# Patient Record
Sex: Female | Born: 1935 | Race: White | Hispanic: No | Marital: Single
Health system: Southern US, Community
[De-identification: ages and names within clinical notes are randomized; demographics above are authoritative.]

---

## 2003-07-30 ENCOUNTER — Encounter: Admission: RE | Admit: 2003-07-30 | Discharge: 2003-07-30 | Payer: Self-pay | Admitting: Neurosurgery

## 2003-08-26 ENCOUNTER — Encounter: Admission: RE | Admit: 2003-08-26 | Discharge: 2003-08-26 | Payer: Self-pay | Admitting: Neurosurgery

## 2003-09-11 ENCOUNTER — Encounter: Admission: RE | Admit: 2003-09-11 | Discharge: 2003-09-11 | Payer: Self-pay | Admitting: Neurosurgery

## 2004-03-10 ENCOUNTER — Ambulatory Visit: Payer: Self-pay

## 2004-12-06 ENCOUNTER — Inpatient Hospital Stay: Payer: Self-pay

## 2005-10-27 ENCOUNTER — Ambulatory Visit: Payer: Self-pay | Admitting: Family Medicine

## 2005-11-28 ENCOUNTER — Ambulatory Visit: Payer: Self-pay | Admitting: Family Medicine

## 2005-11-28 ENCOUNTER — Ambulatory Visit: Payer: Self-pay | Admitting: Gastroenterology

## 2005-12-16 ENCOUNTER — Ambulatory Visit: Payer: Self-pay | Admitting: Family Medicine

## 2005-12-29 ENCOUNTER — Ambulatory Visit: Payer: Self-pay | Admitting: Gastroenterology

## 2006-07-02 ENCOUNTER — Emergency Department: Payer: Self-pay | Admitting: Emergency Medicine

## 2006-07-05 ENCOUNTER — Ambulatory Visit: Payer: Self-pay | Admitting: Gastroenterology

## 2007-03-13 ENCOUNTER — Ambulatory Visit: Payer: Self-pay | Admitting: Family Medicine

## 2007-07-16 ENCOUNTER — Ambulatory Visit: Payer: Self-pay | Admitting: Ophthalmology

## 2008-04-23 ENCOUNTER — Ambulatory Visit: Payer: Self-pay | Admitting: Family Medicine

## 2008-12-01 ENCOUNTER — Ambulatory Visit: Payer: Self-pay | Admitting: Neurology

## 2009-06-03 ENCOUNTER — Ambulatory Visit: Payer: Self-pay | Admitting: Family Medicine

## 2009-12-01 ENCOUNTER — Emergency Department: Payer: Self-pay | Admitting: Emergency Medicine

## 2010-05-30 ENCOUNTER — Encounter: Payer: Self-pay | Admitting: Neurosurgery

## 2010-06-18 ENCOUNTER — Ambulatory Visit: Payer: Self-pay | Admitting: Family Medicine

## 2011-02-22 ENCOUNTER — Encounter: Payer: Self-pay | Admitting: Family Medicine

## 2011-03-10 ENCOUNTER — Encounter: Payer: Self-pay | Admitting: Family Medicine

## 2011-04-09 ENCOUNTER — Encounter: Payer: Self-pay | Admitting: Family Medicine

## 2011-05-10 ENCOUNTER — Encounter: Payer: Self-pay | Admitting: Family Medicine

## 2011-05-31 ENCOUNTER — Inpatient Hospital Stay: Payer: Self-pay | Admitting: Internal Medicine

## 2011-05-31 LAB — URINALYSIS, COMPLETE
Blood: NEGATIVE
Nitrite: NEGATIVE
Protein: NEGATIVE
Specific Gravity: 1.019 (ref 1.003–1.030)

## 2011-05-31 LAB — CBC
MCH: 31.1 pg (ref 26.0–34.0)
Platelet: 159 10*3/uL (ref 150–440)
RBC: 4.28 10*6/uL (ref 3.80–5.20)
RDW: 13.3 % (ref 11.5–14.5)

## 2011-05-31 LAB — COMPREHENSIVE METABOLIC PANEL
Albumin: 3 g/dL — ABNORMAL LOW (ref 3.4–5.0)
Alkaline Phosphatase: 36 U/L — ABNORMAL LOW (ref 50–136)
BUN: 10 mg/dL (ref 7–18)
Bilirubin,Total: 0.5 mg/dL (ref 0.2–1.0)
Osmolality: 293 (ref 275–301)
Potassium: 3.2 mmol/L — ABNORMAL LOW (ref 3.5–5.1)
Total Protein: 5.6 g/dL — ABNORMAL LOW (ref 6.4–8.2)

## 2011-05-31 LAB — TROPONIN I: Troponin-I: 0.02 ng/mL

## 2011-06-01 LAB — CBC WITH DIFFERENTIAL/PLATELET
Basophil %: 0.7 %
Eosinophil #: 0.1 10*3/uL (ref 0.0–0.7)
HCT: 36.2 % (ref 35.0–47.0)
Lymphocyte %: 31.5 %
MCHC: 33.5 g/dL (ref 32.0–36.0)
Monocyte %: 11.9 %
Neutrophil %: 54.9 %

## 2011-06-01 LAB — TSH: Thyroid Stimulating Horm: 1.25 u[IU]/mL

## 2011-06-01 LAB — BASIC METABOLIC PANEL
Anion Gap: 13 (ref 7–16)
BUN: 8 mg/dL (ref 7–18)
Chloride: 112 mmol/L — ABNORMAL HIGH (ref 98–107)
Co2: 23 mmol/L (ref 21–32)
Glucose: 82 mg/dL (ref 65–99)
Osmolality: 292 (ref 275–301)

## 2011-06-02 LAB — BASIC METABOLIC PANEL
Anion Gap: 9 (ref 7–16)
EGFR (African American): >60
EGFR (Non-African Amer.): >60
Osmolality: 288 (ref 275–301)
Potassium: 4.2 mmol/L (ref 3.5–5.1)

## 2011-06-04 LAB — BASIC METABOLIC PANEL
Anion Gap: 9 (ref 7–16)
Calcium, Total: 8.5 mg/dL (ref 8.5–10.1)
Co2: 28 mmol/L (ref 21–32)
EGFR (African American): >60
EGFR (Non-African Amer.): >60
Glucose: 98 mg/dL (ref 65–99)
Osmolality: 289 (ref 275–301)
Sodium: 145 mmol/L (ref 136–145)

## 2011-06-04 LAB — URINE CULTURE

## 2011-06-06 LAB — CULTURE, BLOOD (SINGLE)

## 2011-06-09 ENCOUNTER — Encounter: Payer: Self-pay | Admitting: Internal Medicine

## 2011-06-09 LAB — TSH: Thyroid Stimulating Horm: 3.06 u[IU]/mL

## 2011-06-10 ENCOUNTER — Encounter: Payer: Self-pay | Admitting: Internal Medicine

## 2011-09-13 ENCOUNTER — Ambulatory Visit: Payer: Self-pay | Admitting: Family Medicine

## 2011-11-14 ENCOUNTER — Emergency Department: Payer: Self-pay | Admitting: Emergency Medicine

## 2011-11-14 LAB — CBC
HCT: 41.3 % (ref 35.0–47.0)
HGB: 13.7 g/dL (ref 12.0–16.0)
MCHC: 33.3 g/dL (ref 32.0–36.0)
RBC: 4.45 10*6/uL (ref 3.80–5.20)

## 2011-11-14 LAB — TROPONIN I: Troponin-I: 0.03 ng/mL

## 2011-11-14 LAB — PROTIME-INR
INR: 0.9
Prothrombin Time: 12.7 secs (ref 11.5–14.7)

## 2011-11-14 LAB — URINALYSIS, COMPLETE
Ketone: NEGATIVE
Nitrite: NEGATIVE
Ph: 8 (ref 4.5–8.0)
Protein: NEGATIVE
RBC,UR: NONE SEEN /HPF (ref 0–5)
Transitional Epi: <1

## 2011-11-14 LAB — COMPREHENSIVE METABOLIC PANEL
Alkaline Phosphatase: 63 U/L (ref 50–136)
BUN: 14 mg/dL (ref 7–18)
Bilirubin,Total: 0.4 mg/dL (ref 0.2–1.0)
Creatinine: 0.96 mg/dL (ref 0.60–1.30)
SGPT (ALT): 34 U/L
Total Protein: 6.9 g/dL (ref 6.4–8.2)

## 2011-11-14 LAB — CK TOTAL AND CKMB (NOT AT ARMC)
CK, Total: 375 U/L — ABNORMAL HIGH (ref 21–215)
CK-MB: 7.2 ng/mL — ABNORMAL HIGH (ref 0.5–3.6)

## 2011-12-08 ENCOUNTER — Ambulatory Visit: Payer: Self-pay | Admitting: Internal Medicine

## 2011-12-13 ENCOUNTER — Emergency Department: Payer: Self-pay | Admitting: Emergency Medicine

## 2011-12-14 LAB — CBC WITH DIFFERENTIAL/PLATELET
Basophil #: 0 10*3/uL (ref 0.0–0.1)
Eosinophil %: 0 %
MCH: 30.7 pg (ref 26.0–34.0)
Monocyte #: 0.8 x10 3/mm (ref 0.2–0.9)
Neutrophil %: 92.7 %
Platelet: 395 10*3/uL (ref 150–440)
RBC: 3.62 10*6/uL — ABNORMAL LOW (ref 3.80–5.20)
WBC: 19.2 10*3/uL — ABNORMAL HIGH (ref 3.6–11.0)

## 2011-12-14 LAB — URINALYSIS, COMPLETE
Bacteria: NONE SEEN
Bilirubin,UR: NEGATIVE
Blood: NEGATIVE
Glucose,UR: NEGATIVE mg/dL (ref 0–75)
Nitrite: NEGATIVE
Squamous Epithelial: NONE SEEN
WBC UR: 27 /HPF (ref 0–5)

## 2011-12-14 LAB — COMPREHENSIVE METABOLIC PANEL
Albumin: 2.8 g/dL — ABNORMAL LOW (ref 3.4–5.0)
Alkaline Phosphatase: 122 U/L (ref 50–136)
BUN: 17 mg/dL (ref 7–18)
Bilirubin,Total: 0.4 mg/dL (ref 0.2–1.0)
Creatinine: 0.69 mg/dL (ref 0.60–1.30)
Osmolality: 293 (ref 275–301)
SGPT (ALT): 18 U/L (ref 12–78)
Sodium: 146 mmol/L — ABNORMAL HIGH (ref 136–145)

## 2011-12-14 LAB — LIPASE, BLOOD: Lipase: 35 U/L — ABNORMAL LOW (ref 73–393)

## 2011-12-15 ENCOUNTER — Inpatient Hospital Stay: Payer: Self-pay | Admitting: Internal Medicine

## 2011-12-15 LAB — CBC WITH DIFFERENTIAL/PLATELET
Basophil #: 0.1 10*3/uL (ref 0.0–0.1)
Eosinophil #: 0 10*3/uL (ref 0.0–0.7)
HCT: 31.4 % — ABNORMAL LOW (ref 35.0–47.0)
Lymphocyte #: 0.7 10*3/uL — ABNORMAL LOW (ref 1.0–3.6)
MCH: 30.6 pg (ref 26.0–34.0)
MCHC: 31.4 g/dL — ABNORMAL LOW (ref 32.0–36.0)
MCV: 97 fL (ref 80–100)
Monocyte #: 0.7 x10 3/mm (ref 0.2–0.9)
Neutrophil #: 14.2 10*3/uL — ABNORMAL HIGH (ref 1.4–6.5)
Platelet: 321 10*3/uL (ref 150–440)
RDW: 17.2 % — ABNORMAL HIGH (ref 11.5–14.5)

## 2011-12-15 LAB — BASIC METABOLIC PANEL
Chloride: 110 mmol/L — ABNORMAL HIGH (ref 98–107)
Creatinine: 0.66 mg/dL (ref 0.60–1.30)
EGFR (Non-African Amer.): >60
Glucose: 109 mg/dL — ABNORMAL HIGH (ref 65–99)
Potassium: 3.1 mmol/L — ABNORMAL LOW (ref 3.5–5.1)

## 2011-12-16 LAB — CBC WITH DIFFERENTIAL/PLATELET
Eosinophil %: 0.7 %
HCT: 30.6 % — ABNORMAL LOW (ref 35.0–47.0)
Lymphocyte #: 0.8 10*3/uL — ABNORMAL LOW (ref 1.0–3.6)
MCV: 97 fL (ref 80–100)
Monocyte %: 7.1 %
Neutrophil #: 8.4 10*3/uL — ABNORMAL HIGH (ref 1.4–6.5)
Platelet: 266 10*3/uL (ref 150–440)
RBC: 3.16 10*6/uL — ABNORMAL LOW (ref 3.80–5.20)
WBC: 10 10*3/uL (ref 3.6–11.0)

## 2011-12-16 LAB — COMPREHENSIVE METABOLIC PANEL
Albumin: 2.4 g/dL — ABNORMAL LOW (ref 3.4–5.0)
Anion Gap: 10 (ref 7–16)
BUN: 13 mg/dL (ref 7–18)
Bilirubin,Total: 0.4 mg/dL (ref 0.2–1.0)
Chloride: 110 mmol/L — ABNORMAL HIGH (ref 98–107)
Creatinine: 0.53 mg/dL — ABNORMAL LOW (ref 0.60–1.30)
EGFR (African American): >60
EGFR (Non-African Amer.): >60
Glucose: 96 mg/dL (ref 65–99)
Osmolality: 285 (ref 275–301)
Potassium: 3.3 mmol/L — ABNORMAL LOW (ref 3.5–5.1)
SGOT(AST): 14 U/L — ABNORMAL LOW (ref 15–37)
Sodium: 143 mmol/L (ref 136–145)
Total Protein: 6.1 g/dL — ABNORMAL LOW (ref 6.4–8.2)

## 2011-12-16 LAB — MAGNESIUM: Magnesium: 1.9 mg/dL

## 2011-12-17 LAB — BASIC METABOLIC PANEL
BUN: 10 mg/dL (ref 7–18)
Calcium, Total: 8.6 mg/dL (ref 8.5–10.1)
Chloride: 111 mmol/L — ABNORMAL HIGH (ref 98–107)
Co2: 25 mmol/L (ref 21–32)
EGFR (Non-African Amer.): >60
Glucose: 90 mg/dL (ref 65–99)
Osmolality: 291 (ref 275–301)
Potassium: 3.8 mmol/L (ref 3.5–5.1)

## 2011-12-19 LAB — URINE CULTURE

## 2011-12-19 LAB — MAGNESIUM: Magnesium: 1.8 mg/dL

## 2011-12-19 LAB — PHOSPHORUS: Phosphorus: 2.7 mg/dL (ref 2.5–4.9)

## 2011-12-20 LAB — CULTURE, BLOOD (SINGLE)

## 2012-01-08 ENCOUNTER — Ambulatory Visit: Payer: Self-pay | Admitting: Internal Medicine

## 2012-01-08 DEATH — deceased

## 2013-04-07 IMAGING — CR DG CHEST 2V
1 series · 2 of 2 positions shown · non-contrast
Comparison: none

REASON FOR EXAM: weakness, fever
COMMENTS:

[Series 1: x chest ap · 0.14mm/px · 2 of 2 slices shown]
[im 1/2]
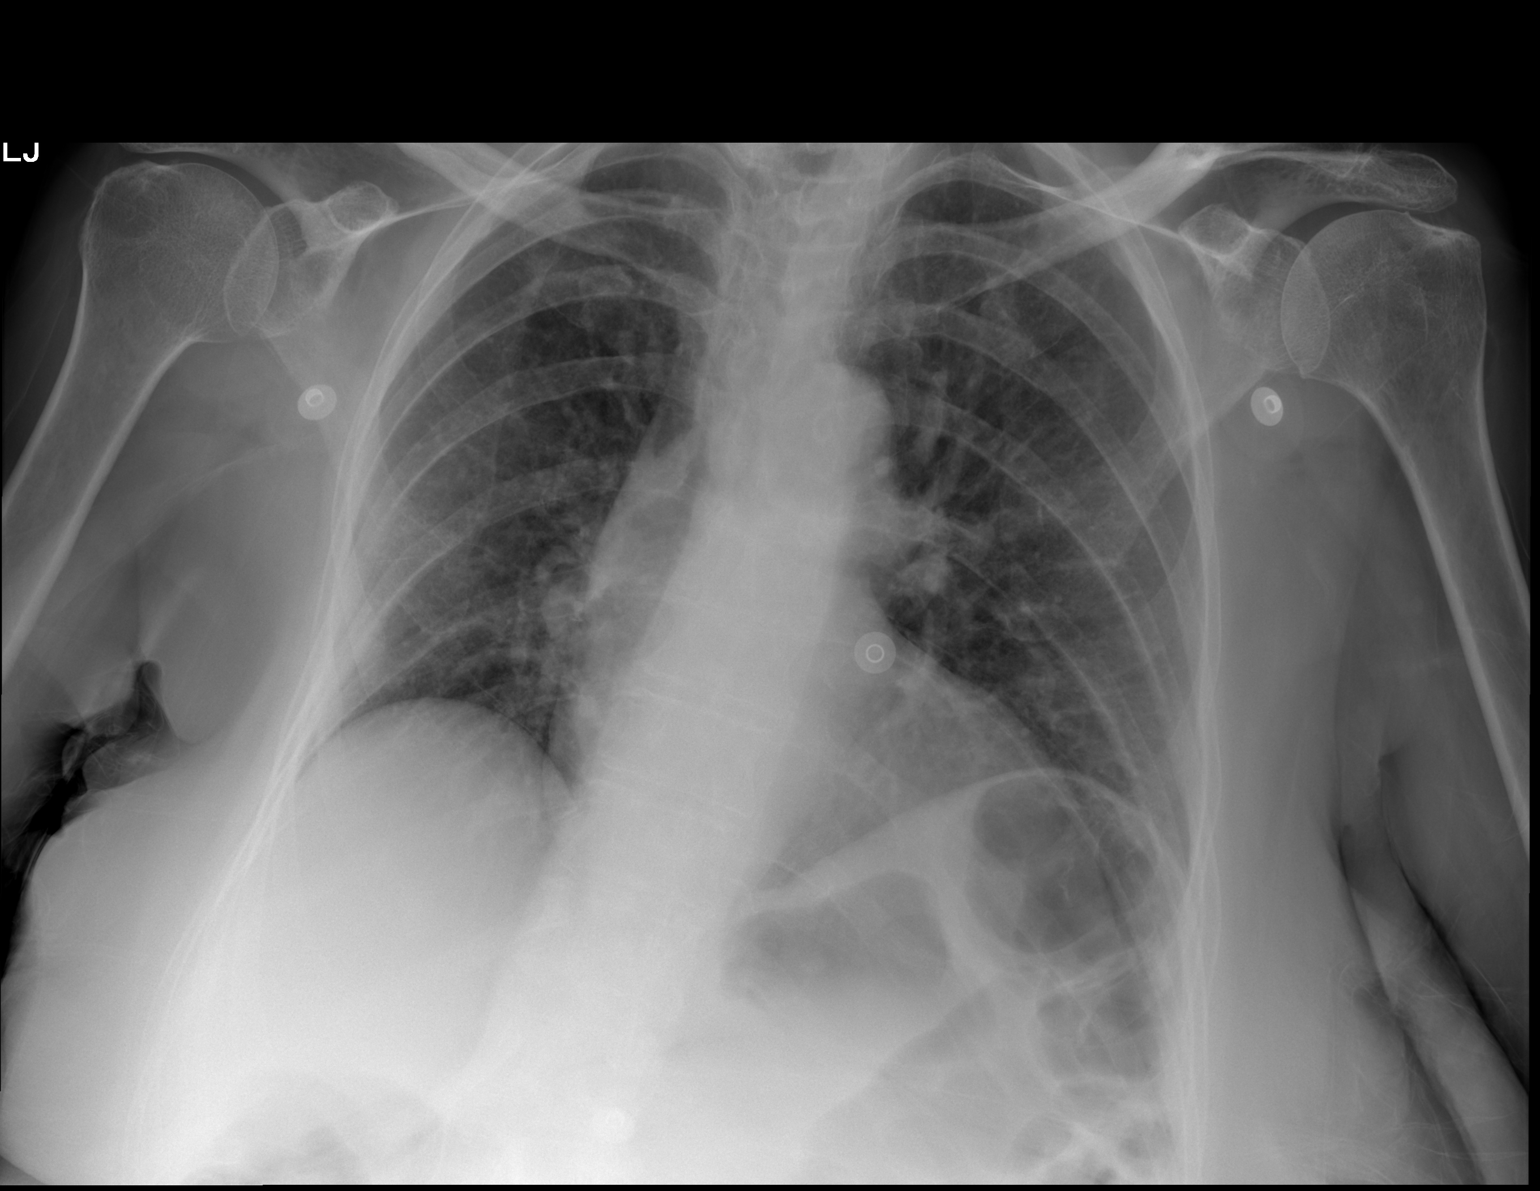
[im 2/2]
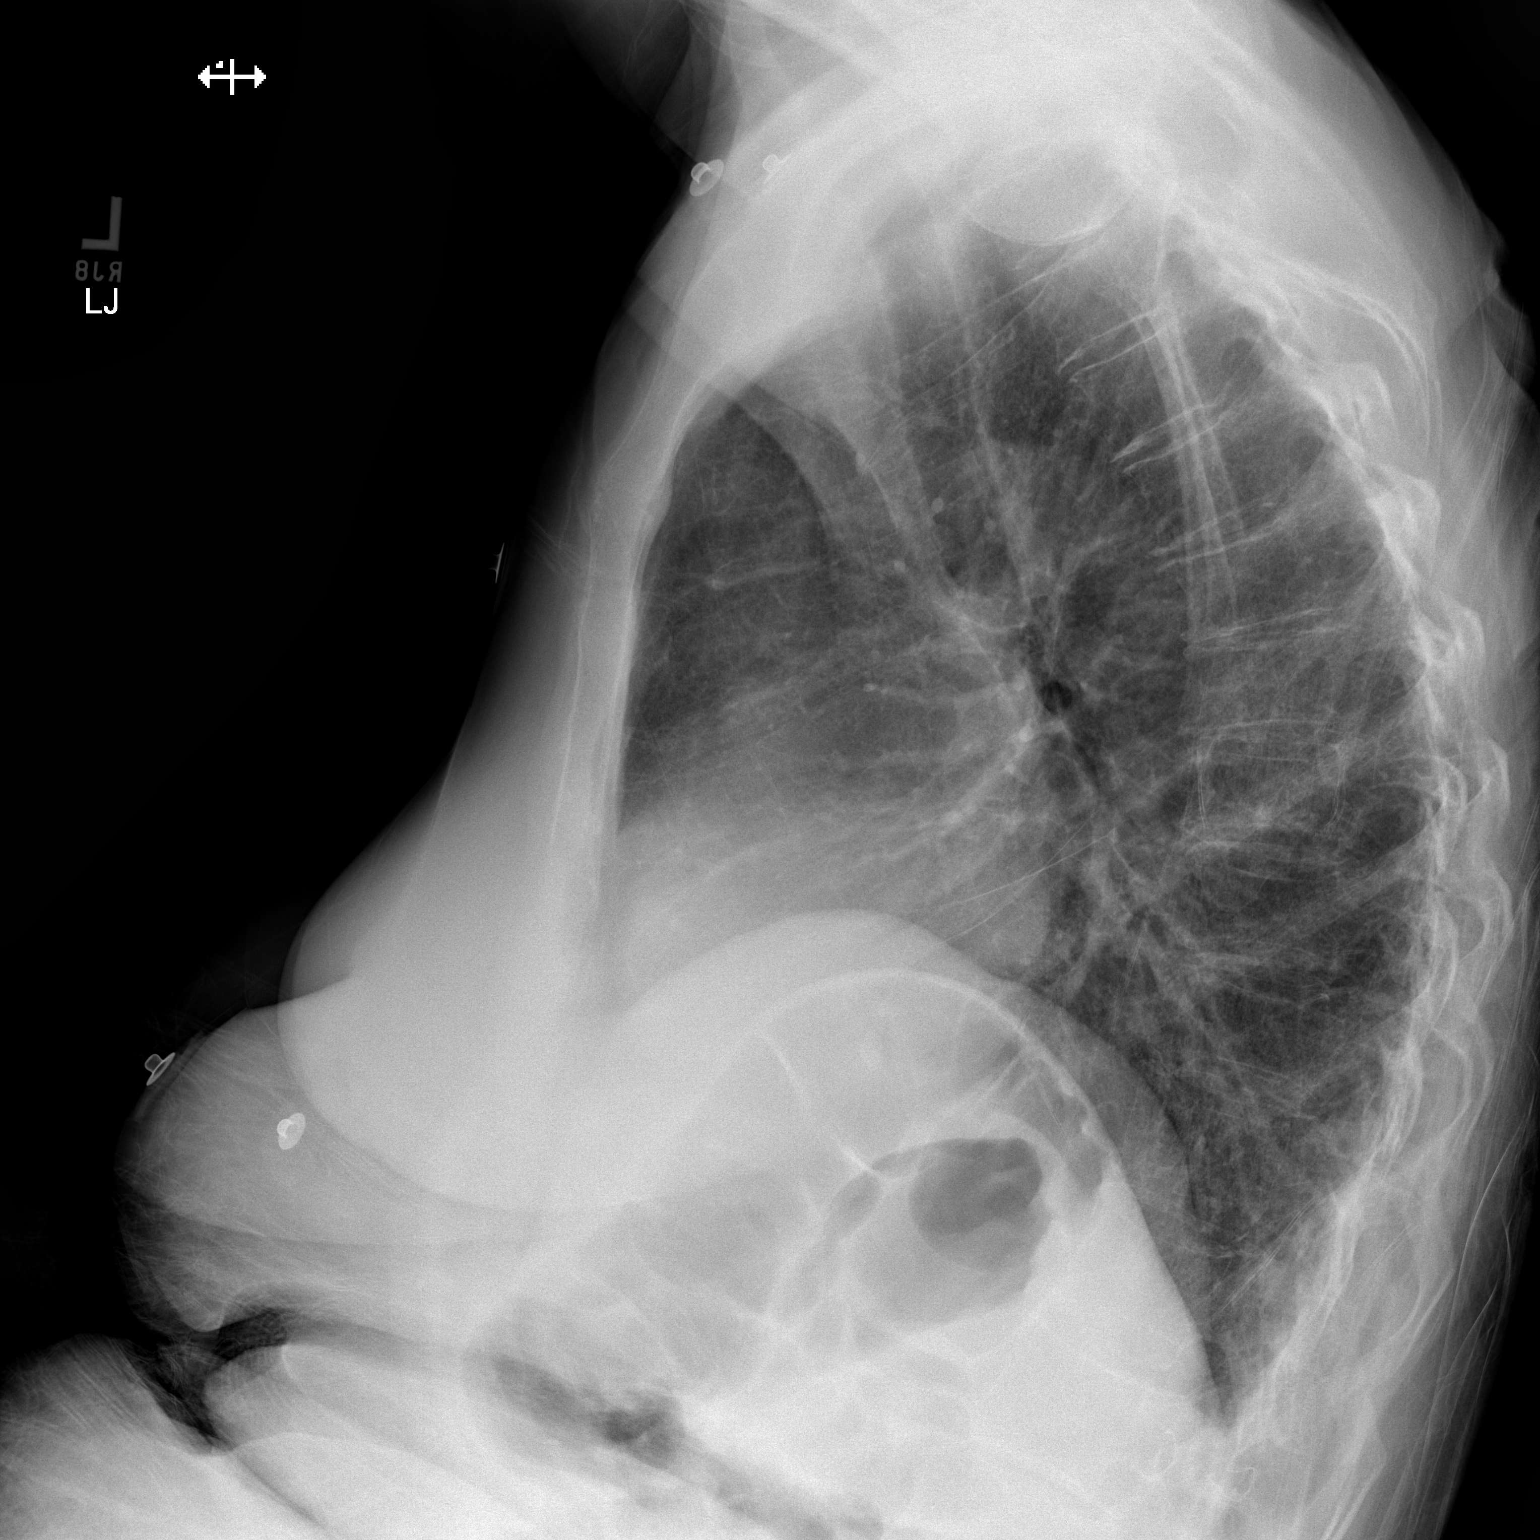

[2 of 2 positions shown; findings below may reference images not displayed]

PROCEDURE:     DXR - DXR CHEST PA (OR AP) AND LATERAL  - May 31, 2011  [DATE]

RESULT:

The patient has taken a shallow inspiration. With technique taken into
consideration, there is no evidence of focal infiltrates, effusions or
edema. Dextroscoliosis is identified within thoracolumbar spine. The cardiac
silhouette is within normal limits. The visualized bony skeleton is
unremarkable.
IMPRESSION: Shallow inspiration without evidence of acute
cardiopulmonary disease.
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
## 2014-08-31 NOTE — Discharge Summary (Signed)
PATIENT NAME:  Chelsea Shaw, Chelsea Shaw MR#:  [id number] DATE OF BIRTH:  1933-01-02  DATE OF ADMISSION:  05/31/2011 DATE OF DISCHARGE:  0128/2013  DISCHARGE DIAGNOSES:  1. Altered mental status secondary to hyponatremia, hypokalemia, resolved and also metabolic encephalopathy due to dehydration.  2. Generalized weakness.  3. Hypothyroidism.  4. Osteoporosis.  5. Hypertension.  6. Depression.   DISCHARGE MEDICATIONS:  1. Simvastatin 40 mg p.o. daily.  2. Celexa 20 mg p.o. daily.  3. Loratadine 10 mg daily. 4. Oxybutynin 5 mg extended release tablet daily.  5. Lyrica 75 mg p.o. daily. 6. Omeprazole 20 mg p.o. daily.  7. Reglan 10 mg with each meal. 8. Magnesium oxide 400 mg p.o. daily.  9. Synthroid 25 mcg p.o. daily.  10. Fosamax 70 mg once a week. 11. Aspirin 81 mg daily.  12. Metoprolol has been changed. She is taking metoprolol 100 mg daily before but I changed it to Toprol XL 25 mg p.o. daily.  13. For constipation she can use Colace 100 mg p.o. b.i.d. p.r.n. for constipation.    DISPOSITION: [facility].   CONSULTATIONS: None.   HOSPITAL COURSE: 81 year old female who lives alone brought in by family because patient was confused and had some nausea, vomiting, and decreased p.o. intake. Patient had fever of 100 in Emergency Room. Look in the history and physical for full details. She had a CT of the chest for confusion which did not show any changes, however, sodium showed 148 and potassium 3.2 and she had probably metabolic encephalopathy with hyponatremia, hypokalemia and also she had urine showing hazy colored urine with leukocyte esterase and trace bacteria. She also admitted for altered mental status with metabolic encephalopathy due to hyponatremia, hypokalemia, urinary tract infection dehydration. Started on IV fluids with potassium replacement. Levaquin was started for urinary tract infection. Patient's blood cultures, urine cultures have been negative. Patient's antibiotics were  stopped as urine did not show any growth. Patient's blood pressure medications have been adjusted because of borderline blood pressure. Patient's medicine is changed to Toprol-XL 25 mg daily from metoprolol 100 mg daily.   Altered mental status resolved after fluids and sodium and potassium have normalized and her white count has been stable and she is afebrile during the hospital stay. We continued hours thyroid medications as well as medicines for her depression. Patient has been seen by physical therapy. Patient had unsteady gait and walked around 75 feet with rolling walker. [facility].   She does have constipation. Did not have bowel movement since admission, however, her bowel sounds are present and her abdomen is soft so will give Colace along with Dulcolax and she will be discharged once she has a bowel movement.   FOLLOW UP: Patient will follow up with her primary doctor in 1 to 2 weeks.   TOTAL TIME SPENT ON DISCHARGE PREPARATION: More than 30 minutes.   Discussed the plan with patient's daughter.  ____________________________ Katha HammingSnehalatha Cissy Galbreath, MD sk:cms D: 06/06/2011 11:30:56 ET T: 06/06/2011 11:40:51 ET JOB#: [id number]  cc: Katha HammingSnehalatha Darcelle Herrada, MD, <Dictator> Katha HammingSNEHALATHA Alani Sabbagh MD ELECTRONICALLY SIGNED 06/21/2011 14:53

## 2014-08-31 NOTE — H&P (Signed)
PATIENT NAME:  Chelsea Shaw, Chelsea Shaw MR#:  [id number] DATE OF BIRTH:  Dec 26, 1932  DATE OF ADMISSION:  05/31/2011  PRIMARY CARE PHYSICIAN: Dr. Carlynn Purl  ER PHYSICIAN: Dr. Gaetano Net  CHIEF COMPLAINT: Altered mental status.   HISTORY OF PRESENT ILLNESS: 81 year old female with history of hypertension, gastroesophageal reflux disease, hypothyroidism, depression, brought in by family because patient was found confused this morning. Patient lives alone. Patient's grandson went to see her and found her confused and also unable to get her out of the bed. According to the patient, patient says that she does not feel well. Daughter says that she did not eat anything since yesterday. She complained of some nausea and vomiting on Sunday and because of the weakness and confusion family brought her here. She denies any abdominal pain She is more alert at this time. Noted to have fever of 100 in the ER. She has no chest pain, trouble breathing. She complains of stuffy nose. Did not have any blurred vision. No speech troubles. Patient did not have any weakness in the legs or hands suggestive of stroke.   PAST MEDICAL HISTORY: 1. Osteoporosis. 2. Hypertension. 3. Hypothyroidism. 4. Hyperlipidemia.  5. Depression.    MEDICATIONS:  1. Fosamax 70 mg once a week.  2. Aspirin 81 mg daily.  3. Citalopram 20 mg daily. 4. Loratadine 10 mg daily. 5. Lyrica 75 mg daily. 6. Magnesium oxide 400 mg daily. 7. Reglan 10 mg with each meal. 8. Metoprolol 100 mg daily. 9. Omeprazole 20 mg p.o. daily.  10. Oxybutynin 5 mg daily. 11. Simvastatin 40 mg p.o. daily.  12. Synthroid 25 mcg p.o. daily.   ALLERGIES: Allergic to neomycin, Neosporin and penicillin.   SOCIAL HISTORY: Never smoked, never drank. Patient lives alone.   PAST SURGICAL HISTORY:  1. Hysterectomy.  2. Tonsillectomy.   FAMILY HISTORY: No history of hypertension or diabetes.   REVIEW OF SYSTEMS: CONSTITUTIONAL: Has fever and weakness. EYES: No blurred  vision. ENT: No tinnitus. Nose feels stuffy. No trouble swallowing. RESPIRATORY: Has no cough. No wheezing. CARDIOVASCULAR: No chest pain. No palpitations. GASTROINTESTINAL: Has some nausea, vomiting two days ago, denies any today. no abdominal pain. No constipation. No diarrhea. GENITOURINARY: Patient has been unable to void today. No dysuria but denies any hematuria. ENDOCRINE: Has a history of hypothyroidism but denies any nocturia or thyroid problems. INTEGUMENTARY: No skin rashes. MUSCULOSKELETAL: Has joint pains and osteoporosis. NEUROLOGIC: No numbness. No headache. No history of transient ischemic attack or stroke. Has confusion today. No numbness. Patient has generalized weakness. PSYCH: Patient has no anxiety. Has history of depression.   PHYSICAL EXAMINATION:  VITAL SIGNS: Temperature 100, pulse 102, respirations 20, blood pressure 120/64, sats 97% on room air.   GENERAL: Alert, awake, oriented.   HEENT: Head atraumatic, normocephalic. Pupils are equally reacting to light. Extraocular movements are intact. ENT: No tympanic membrane congestion. No turbinate hypertrophy. No oropharyngeal erythema.   NECK: Normal range of motion. No JVD. No carotid bruit.   CARDIOVASCULAR: S1, S2, tachycardia present. No murmurs. PMI not displaced. Good femoral pulses present. No pedal edema.   ABDOMEN: Soft, nontender, nondistended. Bowel sounds present. No hepatosplenomegaly.   MUSCULOSKELETAL: Power 5/5 upper and lower extremities.    SKIN: No skin rashes.   LYMPH NODES: No lymphadenopathy in cervical or axillary region.   NEUROLOGIC: Cranial nerves intact. No sensory or motor deficits.   PSYCH: Oriented to time, place, person.   LABORATORY, DIAGNOSTIC AND RADIOLOGICAL DATA:  Urinalysis showed yellow hazy-colored urine with leukocyte  esterase trace and bacteria trace.   Chest x-ray showed shallow inspiration without evidence of acute cardiopulmonary abnormality. CAT scan of the head showed no  evidence of acute ischemia or hemorrhage.   WBC 6.9, hemoglobin 13.3, hematocrit 39.8, platelets 159. Electrolytes: Sodium 148, potassium 3.2, chloride 115, bicarbonate 21, BUN 10, creatinine 0.50, glucose 90, albumin 3, calcium 7.2. Troponin 0.02. EKG: Sinus tach with 102 beats per minute. No ST-T changes.   ASSESSMENT AND PLAN: 81 year old female with history of hypertension and depression came in with:  1. Altered mental status secondary to urinary tract infection and hypokalemia. Patient also has dehydration with elevated sodium and low potassium. She is going to be admitted to medical service. Hydrate her with normal saline at 60 mL with potassium and also continue Levaquin for urinary tract infection. Follow the urine cultures. She is allergic to penicillin so continue the Levaquin. Blood pressure is borderline so we will decrease the dose of metoprolol to 50 mg daily. She was taking 100 mg daily.  2. Gastroesophageal reflux disease, hypothyroidism. Continue the medications for gastroesophageal reflux disease and hypothyroidism.  3. Generalized weakness, probably coming from the urinary tract infection and hypokalemia. Will request a physical therapy evaluation.  4. If the confusion does not resolve patient may need neurology evaluation. I think it may probably be every onset dementia.   TOTAL TIME SPENT ON HISTORY AND PHYSICAL: About 60 minutes.   Discussed with the patient's daughter at beside.   ____________________________ Katha HammingSnehalatha Trent Theisen, MD sk:cms D: 05/31/2011 19:35:35 ET T: 06/01/2011 06:02:42 ET JOB#: [id number]  cc: Katha HammingSnehalatha Callaway Hailes, MD, <Dictator> Onnie BoerKrichna F. Carlynn PurlSowles, MD Katha HammingSNEHALATHA Chellsea Beckers MD ELECTRONICALLY SIGNED 06/27/2011 16:18
